# Patient Record
Sex: Female | Born: 1960 | Race: White | Hispanic: No | Marital: Single | Smoking: Never smoker
Health system: Southern US, Community
[De-identification: ages and names within clinical notes are randomized; demographics above are authoritative.]

## PROBLEM LIST (undated history)

## (undated) DIAGNOSIS — I10 Essential (primary) hypertension: Secondary | ICD-10-CM

## (undated) DIAGNOSIS — C801 Malignant (primary) neoplasm, unspecified: Secondary | ICD-10-CM

## (undated) DIAGNOSIS — C6941 Malignant neoplasm of right ciliary body: Secondary | ICD-10-CM

---

## 2010-01-12 ENCOUNTER — Emergency Department (HOSPITAL_COMMUNITY)
Admission: EM | Admit: 2010-01-12 | Discharge: 2010-01-12 | Payer: Self-pay | Source: Home / Self Care | Admitting: Emergency Medicine

## 2014-06-08 ENCOUNTER — Emergency Department (HOSPITAL_BASED_OUTPATIENT_CLINIC_OR_DEPARTMENT_OTHER)
Admission: EM | Admit: 2014-06-08 | Discharge: 2014-06-08 | Disposition: A | Discharge status: Home / Self Care | Payer: 59 | Attending: Emergency Medicine | Admitting: Emergency Medicine

## 2014-06-08 ENCOUNTER — Encounter (HOSPITAL_BASED_OUTPATIENT_CLINIC_OR_DEPARTMENT_OTHER): Payer: Self-pay

## 2014-06-08 DIAGNOSIS — H05011 Cellulitis of right orbit: Secondary | ICD-10-CM | POA: Diagnosis not present

## 2014-06-08 DIAGNOSIS — H5711 Ocular pain, right eye: Secondary | ICD-10-CM | POA: Diagnosis present

## 2014-06-08 DIAGNOSIS — L03213 Periorbital cellulitis: Secondary | ICD-10-CM

## 2014-06-08 DIAGNOSIS — Z85840 Personal history of malignant neoplasm of eye: Secondary | ICD-10-CM | POA: Diagnosis not present

## 2014-06-08 DIAGNOSIS — I10 Essential (primary) hypertension: Secondary | ICD-10-CM | POA: Diagnosis not present

## 2014-06-08 DIAGNOSIS — Z79899 Other long term (current) drug therapy: Secondary | ICD-10-CM | POA: Diagnosis not present

## 2014-06-08 HISTORY — DX: Malignant neoplasm of right ciliary body: C69.41

## 2014-06-08 HISTORY — DX: Malignant (primary) neoplasm, unspecified: C80.1

## 2014-06-08 HISTORY — DX: Essential (primary) hypertension: I10

## 2014-06-08 MED ORDER — AMOXICILLIN-POT CLAVULANATE 875-125 MG PO TABS: 1.0000 | ORAL_TABLET | Freq: Two times a day (BID) | ORAL | Status: DC

## 2014-06-08 MED ORDER — GENTAMICIN SULFATE 0.3 % OP SOLN: 1.0000 [drp] | OPHTHALMIC | Status: DC

## 2014-06-08 NOTE — ED Notes (Signed)
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 No visual changes no known trauma

## 2014-06-08 NOTE — Discharge Instructions (Signed)
Cellulitis Cellulitis is an infection of the skin and the tissue beneath it. The infected area is usually red and tender. Cellulitis occurs most often in the arms and lower legs.  CAUSES  Cellulitis is caused by bacteria that enter the skin through cracks or cuts in the skin. The most common types of bacteria that cause cellulitis are staphylococci and streptococci. SIGNS AND SYMPTOMS   Redness and warmth.  Swelling.  Tenderness or pain.  Fever. DIAGNOSIS  Your health care provider can usually determine what is wrong based on a physical exam. Blood tests may also be done. TREATMENT  Treatment usually involves taking an antibiotic medicine. HOME CARE INSTRUCTIONS   Take your antibiotic medicine as directed by your health care provider. Finish the antibiotic even if you start to feel better.  Keep the infected arm or leg elevated to reduce swelling.  Apply a warm cloth to the affected area up to 4 times per day to relieve pain.  Take medicines only as directed by your health care provider.  Keep all follow-up visits as directed by your health care provider. SEEK MEDICAL CARE IF:   You notice red streaks coming from the infected area.  Your red area gets larger or turns dark in color.  Your bone or joint underneath the infected area becomes painful after the skin has healed.  Your infection returns in the same area or another area.  You notice a swollen bump in the infected area.  You develop new symptoms.  You have a fever. SEEK IMMEDIATE MEDICAL CARE IF:   You feel very sleepy.  You develop vomiting or diarrhea.  You have a general ill feeling (malaise) with muscle aches and pains. MAKE SURE YOU:   Understand these instructions.  Will watch your condition.  Will get help right away if you are not doing well or get worse. Document Released: 10/13/2004 Document Revised: 05/20/2013 Document Reviewed: 03/21/2011 [facility] Patient Information 2015 [place], [place].  This information is not intended to replace advice given to you by your health care provider. Make sure you discuss any questions you have with your health care provider.  Periorbital Cellulitis Periorbital cellulitis is a common infection that can affect the eyelid and the soft tissues that surround the eyeball. The infection may also affect the structures that produce and drain tears. It does not affect the eyeball itself. Natural tissue barriers usually prevent the spread of this infection to the eyeball and other deeper areas of the eye socket.  CAUSES  Bacterial infection.  Long-term (chronic) sinus infections.  An object (foreign body) stuck behind the eye.  An injury that goes through the eyelid tissues.  An injury that causes an infection, such as an insect sting.  Fracture of the bone around the eye.  Infections which have spread from the eyelid or other structures around the eye.  Bite wounds.  Inflammation or infection of the lining membranes of the brain (meningitis).  An infection in the blood (septicemia).  Dental infection (abscess).  Viral infection (this is rare). SYMPTOMS Symptoms usually come on suddenly.  Pain in the eye.  Red, hot, and swollen eyelids and possibly cheeks. The swelling is sometimes bad enough that the eyelids cannot open. Some infections make the eyelids look purple.  Fever and feeling generally ill.  Pain when touching the area around the eye. DIAGNOSIS  Periorbital cellulitis can be diagnosed from an eye exam. In severe cases, your caregiver might suggest:  Blood tests.  Imaging tests (such as  a CT scan) to examine the sinuses and the area around and behind the eyeball. TREATMENT If your caregiver feels that you do not have any signs of serious infection, treatment may include:  Antibiotics.  Nasal decongestants to reduce swelling.  Referral to a dentist if it is suspected that the infection was caused by a prior tooth  infection.  Examination every day to make sure the problem is improving. HOME CARE INSTRUCTIONS  Take your antibiotics as directed. Finish them even if you start to feel better.  Some pain is normal with this condition. Take pain medicine as directed by your caregiver. Only take pain medicines approved by your caregiver.  It is important to drink fluids. Drink enough water and fluids to keep your urine clear or pale yellow.  Do not smoke.  Rest and get plenty of sleep.  Mild or moderate fevers generally have no long-term effects and often do not require treatment.  If your caregiver has given you a follow-up appointment, it is very important to keep that appointment. Your caregiver will need to make sure that the infection is getting better. It is important to check that a more serious infection is not developing. SEEK IMMEDIATE MEDICAL CARE IF:  Your eyelids become more painful, red, warm, or swollen.  You develop double vision or your vision becomes blurred or worsens in any way.  You have trouble moving your eyes.  The eye looks like it is popping out (proptosis).  You develop a severe headache, severe neck pain, or neck stiffness.  You develop repeated vomiting.  You have a fever or persistent symptoms for more than 72 hours.  You have a fever and your symptoms suddenly get worse. MAKE SURE YOU:  Understand these instructions.  Will watch your condition.  Will get help right away if you are not doing well or get worse. Document Released: 02/05/2010 Document Revised: 03/28/2011 Document Reviewed: 02/05/2010 [facility] Patient Information 2015 [place], [place]. This information is not intended to replace advice given to you by your health care provider. Make sure you discuss any questions you have with your health care provider.

## 2014-06-08 NOTE — ED Provider Notes (Signed)
CSN: [id number]     Arrival date & time 06/08/14  6387 History   First MD Initiated Contact with Patient 06/08/14 1105     Chief Complaint  Patient presents with  . Eye Pain      HPI  Vision presents for valuation of redness and swelling inferior to her right eye. She's had a recent diagnosis of an iris malignancy. This was diagnosed after routine ophthalmological exam showed an irregular pupil. She's had ultrasound of the orbit and multiple other noninvasive test. Hasn't follow-up pending with her ophthalmologist. She noted 2 days ago that there is some redness of her lower lid. Now she has redness and soft tissue swelling at the medial canthus in inferior lid onto the malar eminence and presents here. No vision changes no other abnormalities. No fevers chills.  Past Medical History  Diagnosis Date  . Cancer   . Hypertension   . Cancer of iris of right eye   . Cancer of iris of right eye    History reviewed. No pertinent past surgical history. No family history on file. History  Substance Use Topics  . Smoking status: Never Smoker   . Smokeless tobacco: Not on file  . Alcohol Use: Not on file   OB History    No data available     Review of Systems  Constitutional: Negative for fever, chills, diaphoresis, appetite change and fatigue.  HENT: Negative for mouth sores, sore throat and trouble swallowing.   Eyes: Positive for redness. Negative for visual disturbance.  Respiratory: Negative for cough, chest tightness, shortness of breath and wheezing.   Cardiovascular: Negative for chest pain.  Gastrointestinal: Negative for nausea, vomiting, abdominal pain, diarrhea and abdominal distention.  Endocrine: Negative for polydipsia, polyphagia and polyuria.  Genitourinary: Negative for dysuria, frequency and hematuria.  Musculoskeletal: Negative for gait problem.  Skin: Negative for color change, pallor and rash.  Neurological: Negative for dizziness, syncope, light-headedness and  headaches.  Hematological: Does not bruise/bleed easily.  Psychiatric/Behavioral: Negative for behavioral problems and confusion.      Allergies  Codeine and Sulfa antibiotics  Home Medications   Prior to Admission medications   Medication Sig Start Date End Date Taking? Authorizing Provider  lisinopril (PRINIVIL,ZESTRIL) 20 MG tablet Take 20 mg by mouth daily.   Yes Historical Provider, MD  spironolactone (ALDACTONE) 25 MG tablet Take 25 mg by mouth daily.   Yes Historical Provider, MD  amoxicillin-clavulanate (AUGMENTIN) 875-125 MG per tablet Take 1 tablet by mouth 2 (two) times daily. 06/08/14   Tanna Furry, MD  gentamicin (GARAMYCIN) 0.3 % ophthalmic solution Place 1 drop into the left eye every 4 (four) hours. 06/08/14   Tanna Furry, MD   BP 136/91 mmHg  Pulse 73  Temp(Src) 97.9 F (36.6 C) (Oral)  Resp 18  Ht 5\' 3"  (1.6 m)  Wt 275 lb (124.739 kg)  BMI 48.73 kg/m2  SpO2 97% Physical Exam  Constitutional: She is oriented to person, place, and time. She appears well-developed and well-nourished. No distress.  HENT:  Head: Normocephalic.    Erythema and soft tissue swelling to the inferior lid towards the medial canthus. No proptosis. No EOM abnormalities. No subjective diplopia  Eyes: Conjunctivae are normal. No scleral icterus.    Neck: Normal range of motion. Neck supple. No thyromegaly present.  Cardiovascular: Normal rate and regular rhythm.  Exam reveals no gallop and no friction rub.   No murmur heard. Pulmonary/Chest: Effort normal and breath sounds normal. No respiratory distress. She has  no wheezes. She has no rales.  Abdominal: Soft. Bowel sounds are normal. She exhibits no distension. There is no tenderness. There is no rebound.  Musculoskeletal: Normal range of motion.  Neurological: She is alert and oriented to person, place, and time.  Skin: Skin is warm and dry. No rash noted.  Psychiatric: She has a normal mood and affect. Her behavior is normal.    ED  Course  Procedures (including critical care time) Labs Review Labs Reviewed - No data to display  Imaging Review No results found.   EKG Interpretation None      MDM   Final diagnoses:  Periorbital cellulitis    Patient does not appear toxic. She does not have proptosis. No limitation of neck shock or movement or diplopia. No symptoms of suggested post-septal, or orbital cellulitis. Plan is Garamycin drops and warm compresses. By mouth Augmentin. Recheck with any worsening symptoms.    Tanna Furry, MD 06/08/14 [phone]

## 2014-08-21 ENCOUNTER — Emergency Department (HOSPITAL_BASED_OUTPATIENT_CLINIC_OR_DEPARTMENT_OTHER)
Admission: EM | Admit: 2014-08-21 | Discharge: 2014-08-21 | Disposition: A | Discharge status: Home / Self Care | Payer: 59 | Attending: Emergency Medicine | Admitting: Emergency Medicine

## 2014-08-21 ENCOUNTER — Encounter (HOSPITAL_BASED_OUTPATIENT_CLINIC_OR_DEPARTMENT_OTHER): Payer: Self-pay | Admitting: *Deleted

## 2014-08-21 DIAGNOSIS — I10 Essential (primary) hypertension: Secondary | ICD-10-CM | POA: Insufficient documentation

## 2014-08-21 DIAGNOSIS — Z85840 Personal history of malignant neoplasm of eye: Secondary | ICD-10-CM | POA: Diagnosis not present

## 2014-08-21 DIAGNOSIS — Z792 Long term (current) use of antibiotics: Secondary | ICD-10-CM | POA: Insufficient documentation

## 2014-08-21 DIAGNOSIS — Z79899 Other long term (current) drug therapy: Secondary | ICD-10-CM | POA: Diagnosis not present

## 2014-08-21 DIAGNOSIS — R109 Unspecified abdominal pain: Secondary | ICD-10-CM | POA: Diagnosis not present

## 2014-08-21 DIAGNOSIS — M5432 Sciatica, left side: Secondary | ICD-10-CM | POA: Insufficient documentation

## 2014-08-21 DIAGNOSIS — M549 Dorsalgia, unspecified: Secondary | ICD-10-CM | POA: Diagnosis present

## 2014-08-21 LAB — URINALYSIS, ROUTINE W REFLEX MICROSCOPIC
Bilirubin Urine: NEGATIVE
Glucose, UA: NEGATIVE mg/dL
Hgb urine dipstick: NEGATIVE
Ketones, ur: NEGATIVE mg/dL
Leukocytes, UA: NEGATIVE
Nitrite: NEGATIVE
PROTEIN: NEGATIVE mg/dL
Specific Gravity, Urine: 1.022 (ref 1.005–1.030)
UROBILINOGEN UA: 0.2 mg/dL (ref 0.0–1.0)
pH: 6 (ref 5.0–8.0)

## 2014-08-21 MED ORDER — PREDNISONE 10 MG PO TABS: 20.0000 mg | ORAL_TABLET | Freq: Two times a day (BID) | ORAL | Status: DC

## 2014-08-21 MED ORDER — HYDROCODONE-ACETAMINOPHEN 5-325 MG PO TABS: 1.0000 | ORAL_TABLET | Freq: Four times a day (QID) | ORAL | Status: DC | PRN

## 2014-08-21 NOTE — Discharge Instructions (Signed)
Prednisone as prescribed.  Hydrocodone as prescribed as needed for pain.  Follow-up with your primary Dr. if not improving in the next week to discuss physical therapy or imaging studies.   Sciatica Sciatica is pain, weakness, numbness, or tingling along the path of the sciatic nerve. The nerve starts in the lower back and runs down the back of each leg. The nerve controls the muscles in the lower leg and in the back of the knee, while also providing sensation to the back of the thigh, lower leg, and the sole of your foot. Sciatica is a symptom of another medical condition. For instance, nerve damage or certain conditions, such as a herniated disk or bone spur on the spine, pinch or put pressure on the sciatic nerve. This causes the pain, weakness, or other sensations normally associated with sciatica. Generally, sciatica only affects one side of the body. CAUSES   Herniated or slipped disc.  Degenerative disk disease.  A pain disorder involving the narrow muscle in the buttocks (piriformis syndrome).  Pelvic injury or fracture.  Pregnancy.  Tumor (rare). SYMPTOMS  Symptoms can vary from mild to very severe. The symptoms usually travel from the low back to the buttocks and down the back of the leg. Symptoms can include:  Mild tingling or dull aches in the lower back, leg, or hip.  Numbness in the back of the calf or sole of the foot.  Burning sensations in the lower back, leg, or hip.  Sharp pains in the lower back, leg, or hip.  Leg weakness.  Severe back pain inhibiting movement. These symptoms may get worse with coughing, sneezing, laughing, or prolonged sitting or standing. Also, being overweight may worsen symptoms. DIAGNOSIS  Your caregiver will perform a physical exam to look for common symptoms of sciatica. He or she may ask you to do certain movements or activities that would trigger sciatic nerve pain. Other tests may be performed to find the cause of the sciatica.  These may include:  Blood tests.  X-rays.  Imaging tests, such as an MRI or CT scan. TREATMENT  Treatment is directed at the cause of the sciatic pain. Sometimes, treatment is not necessary and the pain and discomfort goes away on its own. If treatment is needed, your caregiver may suggest:  Over-the-counter medicines to relieve pain.  Prescription medicines, such as anti-inflammatory medicine, muscle relaxants, or narcotics.  Applying heat or ice to the painful area.  Steroid injections to lessen pain, irritation, and inflammation around the nerve.  Reducing activity during periods of pain.  Exercising and stretching to strengthen your abdomen and improve flexibility of your spine. Your caregiver may suggest losing weight if the extra weight makes the back pain worse.  Physical therapy.  Surgery to eliminate what is pressing or pinching the nerve, such as a bone spur or part of a herniated disk. HOME CARE INSTRUCTIONS   Only take over-the-counter or prescription medicines for pain or discomfort as directed by your caregiver.  Apply ice to the affected area for 20 minutes, 3-4 times a day for the first 48-72 hours. Then try heat in the same way.  Exercise, stretch, or perform your usual activities if these do not aggravate your pain.  Attend physical therapy sessions as directed by your caregiver.  Keep all follow-up appointments as directed by your caregiver.  Do not wear high heels or shoes that do not provide proper support.  Check your mattress to see if it is too soft. A firm mattress  may lessen your pain and discomfort. SEEK IMMEDIATE MEDICAL CARE IF:   You lose control of your bowel or bladder (incontinence).  You have increasing weakness in the lower back, pelvis, buttocks, or legs.  You have redness or swelling of your back.  You have a burning sensation when you urinate.  You have pain that gets worse when you lie down or awakens you at night.  Your pain  is worse than you have experienced in the past.  Your pain is lasting longer than 4 weeks.  You are suddenly losing weight without reason. MAKE SURE YOU:  Understand these instructions.  Will watch your condition.  Will get help right away if you are not doing well or get worse. Document Released: 12/28/2000 Document Revised: 07/05/2011 Document Reviewed: 05/15/2011 [facility] Patient Information 2015 [place], [place]. This information is not intended to replace advice given to you by your health care provider. Make sure you discuss any questions you have with your health care provider.

## 2014-08-21 NOTE — ED Provider Notes (Signed)
CSN: [id number]     Arrival date & time 08/21/14  1945 History  This chart was scribed for Veryl Speak, MD by Julien Nordmann, ED Scribe. This patient was seen in room MH09/MH09 and the patient's care was started at 8:08 PM.    Chief Complaint  Patient presents with  . Back Pain      Patient is a 54 y.o. female presenting with back pain and flank pain. The history is provided by the patient. No language interpreter was used.  Back Pain Location:  Unable to specify Duration:  1 week Timing:  Constant Progression:  Worsening Chronicity:  New Relieved by:  Nothing Worsened by:  Ambulation, sitting and lying down Ineffective treatments:  Bed rest and OTC medications Associated symptoms: no dysuria   Flank Pain   HPI Comments: Dawn Cochran is a 54 y.o. female who presents to the Emergency Department complaining of constant, gradual worsening left sided flank pain onset 6 days ago. Pt states the pain radiates to her left hip and down her left leg. She reports the pain is severe whether she is standing or sitting down. She states when she is ambulatory the pain is not as bad. Pt has tried using pillows and home remedies to alleviate the pain with no relief. Pt has She denies any injury to the area, weakness in legs, numbness in legs, bowel/bladder incontinence, and dysuria.  Past Medical History  Diagnosis Date  . Cancer   . Hypertension   . Cancer of iris of right eye   . Cancer of iris of right eye    History reviewed. No pertinent past surgical history. History reviewed. No pertinent family history. History  Substance Use Topics  . Smoking status: Never Smoker   . Smokeless tobacco: Not on file  . Alcohol Use: Not on file   OB History    No data available     Review of Systems  Genitourinary: Positive for flank pain. Negative for dysuria.  Musculoskeletal: Positive for back pain.  All other systems reviewed and are negative.     Allergies  Codeine and Sulfa  antibiotics  Home Medications   Prior to Admission medications   Medication Sig Start Date End Date Taking? Authorizing Provider  amoxicillin-clavulanate (AUGMENTIN) 875-125 MG per tablet Take 1 tablet by mouth 2 (two) times daily. 06/08/14   Tanna Furry, MD  gentamicin (GARAMYCIN) 0.3 % ophthalmic solution Place 1 drop into the left eye every 4 (four) hours. 06/08/14   Tanna Furry, MD  lisinopril (PRINIVIL,ZESTRIL) 20 MG tablet Take 20 mg by mouth daily.    Historical Provider, MD  spironolactone (ALDACTONE) 25 MG tablet Take 25 mg by mouth daily.    Historical Provider, MD   Triage vitals: BP 164/96 mmHg  Pulse 100  Temp(Src) 98.2 F (36.8 C) (Oral)  Resp 18  Ht 5\' 3"  (1.6 m)  Wt 270 lb (122.471 kg)  BMI 47.84 kg/m2  SpO2 99% Physical Exam  Constitutional: She is oriented to person, place, and time. She appears well-developed and well-nourished. No distress.  HENT:  Head: Normocephalic and atraumatic.  Eyes: EOM are normal.  Neck: Normal range of motion.  Cardiovascular: Normal rate, regular rhythm and normal heart sounds.   Pulmonary/Chest: Effort normal and breath sounds normal.  Abdominal: Soft. She exhibits no distension. There is no tenderness.  Musculoskeletal: Normal range of motion.  There is Tenderness to palpation in the upper left buttock and lumbar region.   Neurological: She is alert and oriented  to person, place, and time.  DTRs are 1+ and symmetrical in BLE. Strength is 5 out of 5 in BLE. Able to walk without difficulty.  Skin: Skin is warm and dry.  Psychiatric: She has a normal mood and affect. Judgment normal.  Nursing note and vitals reviewed.   ED Course  Procedures  DIAGNOSTIC STUDIES: Oxygen Saturation is 99% on RA, normal by my interpretation.  COORDINATION OF CARE:  8:16 PM Discussed treatment plan which includes prednisone with pt at bedside and pt agreed to plan.  Labs Review Labs Reviewed  URINALYSIS, ROUTINE W REFLEX MICROSCOPIC (NOT AT [facility])     Imaging Review No results found.   EKG Interpretation None      MDM   Final diagnoses:  None    UA is negative for infection. Her physical exam and presentation is most consistent with a musculoskeletal etiology. I suspect she has an element of sciatica as the pain radiates into her buttock and leg. Her neurologic exam is nonfocal and there are no bowel or bladder complaints that would be suggestive of an emergent situation. She will be treated with steroids, pain medication, and when necessary return.  I personally performed the services described in this documentation, which was scribed in my presence. The recorded information has been reviewed and is accurate.     Veryl Speak, MD 08/21/14 2029

## 2014-08-21 NOTE — ED Notes (Addendum)
Pt reports back pain x 1 week, worsening on left side.  Reports urinary frequency.

## 2015-07-16 ENCOUNTER — Emergency Department (HOSPITAL_BASED_OUTPATIENT_CLINIC_OR_DEPARTMENT_OTHER)
Admission: EM | Admit: 2015-07-16 | Discharge: 2015-07-16 | Disposition: A | Discharge status: Home / Self Care | Payer: 59 | Attending: Emergency Medicine | Admitting: Emergency Medicine

## 2015-07-16 ENCOUNTER — Encounter (HOSPITAL_BASED_OUTPATIENT_CLINIC_OR_DEPARTMENT_OTHER): Payer: Self-pay | Admitting: *Deleted

## 2015-07-16 DIAGNOSIS — I10 Essential (primary) hypertension: Secondary | ICD-10-CM | POA: Insufficient documentation

## 2015-07-16 DIAGNOSIS — Z859 Personal history of malignant neoplasm, unspecified: Secondary | ICD-10-CM | POA: Insufficient documentation

## 2015-07-16 DIAGNOSIS — Z85840 Personal history of malignant neoplasm of eye: Secondary | ICD-10-CM | POA: Insufficient documentation

## 2015-07-16 NOTE — ED Notes (Signed)
Pt states "I feel fine, I just need a paper signed because my blood pressure was high for my pre employment physical." pt denies any c/o states her dad passed away recently and she thinks that has made her bp elevated at times.

## 2015-07-16 NOTE — ED Provider Notes (Signed)
CSN: [id number]     Arrival date & time 07/16/15  1031 History   First MD Initiated Contact with Patient 07/16/15 1046     Chief Complaint  Patient presents with  . needs note to return to work      (Consider location/radiation/quality/duration/timing/severity/associated sxs/prior Treatment) HPI Dawn Cochran This 55 year old female who presents the emergency department needing a note to return to work. Patient states that she had surgery on her right eye. She saw another physician for a return to work physical. However, that physician would not clear her because she had elevated blood pressure at the time. The patient states that her father had just died and she is extremely upset. She states that she needs to get back to work so that she can follow up with her physician to be able to pay her co-pay's. She denies chest pain, shortness of breath, neurologic symptoms.  Past Medical History  Diagnosis Date  . Cancer ([place])   . Hypertension   . Cancer of iris of right eye ([place])   . Cancer of iris of right eye ([place])    History reviewed. No pertinent past surgical history. History reviewed. No pertinent family history. Social History  Substance Use Topics  . Smoking status: Never Smoker   . Smokeless tobacco: None  . Alcohol Use: None   OB History    No data available     Review of Systems   Ten systems reviewed and are negative for acute change, except as noted in the HPI.   Allergies  Codeine and Sulfa antibiotics  Home Medications   Prior to Admission medications   Not on File   BP 137/95 mmHg  Pulse 98  Temp(Src) 98 F (36.7 C) (Oral)  Resp 20  Ht 5\' 4"  (1.626 m)  Wt 124.739 kg  BMI 47.18 kg/m2  SpO2 98% Physical Exam Physical Exam  Nursing note and vitals reviewed. Constitutional: She is oriented to person, place, and time. She appears well-developed and well-nourished. No distress.  HENT:  Head: Normocephalic and atraumatic.  Eyes: Conjunctivae normal  and EOM are normal. Pupils are equal, round, and reactive to light. No scleral icterus.  Neck: Normal range of motion.  Cardiovascular: Normal rate, regular rhythm and normal heart sounds.  Exam reveals no gallop and no friction rub.   No murmur heard. Pulmonary/Chest: Effort normal and breath sounds normal. No respiratory distress.  Abdominal: Soft. Bowel sounds are normal. She exhibits no distension and no mass. There is no tenderness. There is no guarding.  Neurological: She is alert and oriented to person, place, and time.  Skin: Skin is warm and dry. She is not diaphoretic.     ED Course  Procedures (including critical care time) Labs Review Labs Reviewed - No data to display  Imaging Review No results found. I have personally reviewed and evaluated these images and lab results as part of my medical decision-making.   EKG Interpretation None      MDM   Final diagnoses:  Essential hypertension   BP 137/95 mmHg  Pulse 98  Temp(Src) 98 F (36.7 C) (Oral)  Resp 20  Ht 5\' 4"  (1.626 m)  Wt 124.739 kg  BMI 47.18 kg/m2  SpO2 98% Patient blood pressure appears to have improved.  Patient will be given a return to work note here in the emergency department. She appears safe for discharge at this time.    Margarita Mail, PA-C 07/16/15 [place], MD 07/17/15 [phone]

## 2015-07-16 NOTE — Discharge Instructions (Signed)
DASH Eating Plan °DASH stands for "Dietary Approaches to Stop Hypertension." The DASH eating plan is a healthy eating plan that has been shown to reduce high blood pressure (hypertension). Additional health benefits may include reducing the risk of type 2 diabetes mellitus, heart disease, and stroke. The DASH eating plan may also help with weight loss. °WHAT DO I NEED TO KNOW ABOUT THE DASH EATING PLAN? °For the DASH eating plan, you will follow these general guidelines: °· Choose foods with a percent daily value for sodium of less than 5% (as listed on the food label). °· Use salt-free seasonings or herbs instead of table salt or sea salt. °· Check with your health care provider or pharmacist before using salt substitutes. °· Eat lower-sodium products, often labeled as "lower sodium" or "no salt added." °· Eat fresh foods. °· Eat more vegetables, fruits, and low-fat dairy products. °· Choose whole grains. Look for the word "whole" as the first word in the ingredient list. °· Choose fish and skinless chicken or turkey more often than red meat. Limit fish, poultry, and meat to 6 oz (170 g) each day. °· Limit sweets, desserts, sugars, and sugary drinks. °· Choose heart-healthy fats. °· Limit cheese to 1 oz (28 g) per day. °· Eat more home-cooked food and less restaurant, buffet, and fast food. °· Limit fried foods. °· Cook foods using methods other than frying. °· Limit canned vegetables. If you do use them, rinse them well to decrease the sodium. °· When eating at a restaurant, ask that your food be prepared with less salt, or no salt if possible. °WHAT FOODS CAN I EAT? °Seek help from a dietitian for individual calorie needs. °Grains °Whole grain or whole wheat bread. Brown rice. Whole grain or whole wheat pasta. Quinoa, bulgur, and whole grain cereals. Low-sodium cereals. Corn or whole wheat flour tortillas. Whole grain cornbread. Whole grain crackers. Low-sodium crackers. °Vegetables °Fresh or frozen vegetables  (raw, steamed, roasted, or grilled). Low-sodium or reduced-sodium tomato and vegetable juices. Low-sodium or reduced-sodium tomato sauce and paste. Low-sodium or reduced-sodium canned vegetables.  °Fruits °All fresh, canned (in natural juice), or frozen fruits. °Meat and Other Protein Products °Ground beef (85% or leaner), grass-fed beef, or beef trimmed of fat. Skinless chicken or turkey. Ground chicken or turkey. Pork trimmed of fat. All fish and seafood. Eggs. Dried beans, peas, or lentils. Unsalted nuts and seeds. Unsalted canned beans. °Dairy °Low-fat dairy products, such as skim or 1% milk, 2% or reduced-fat cheeses, low-fat ricotta or cottage cheese, or plain low-fat yogurt. Low-sodium or reduced-sodium cheeses. °Fats and Oils °Tub margarines without trans fats. Light or reduced-fat mayonnaise and salad dressings (reduced sodium). Avocado. Safflower, olive, or canola oils. Natural peanut or almond butter. °Other °Unsalted popcorn and pretzels. °The items listed above may not be a complete list of recommended foods or beverages. Contact your dietitian for more options. °WHAT FOODS ARE NOT RECOMMENDED? °Grains °White bread. White pasta. White rice. Refined cornbread. Bagels and croissants. Crackers that contain trans fat. °Vegetables °Creamed or fried vegetables. Vegetables in a cheese sauce. Regular canned vegetables. Regular canned tomato sauce and paste. Regular tomato and vegetable juices. °Fruits °Dried fruits. Canned fruit in light or heavy syrup. Fruit juice. °Meat and Other Protein Products °Fatty cuts of meat. Ribs, chicken wings, bacon, sausage, bologna, salami, chitterlings, fatback, hot dogs, bratwurst, and packaged luncheon meats. Salted nuts and seeds. Canned beans with salt. °Dairy °Whole or 2% milk, cream, half-and-half, and cream cheese. Whole-fat or sweetened yogurt. Full-fat   cheeses or blue cheese. Nondairy creamers and whipped toppings. Processed cheese, cheese spreads, or cheese  curds. °Condiments °Onion and garlic salt, seasoned salt, table salt, and sea salt. Canned and packaged gravies. Worcestershire sauce. Tartar sauce. Barbecue sauce. Teriyaki sauce. Soy sauce, including reduced sodium. Steak sauce. Fish sauce. Oyster sauce. Cocktail sauce. Horseradish. Ketchup and mustard. Meat flavorings and tenderizers. Bouillon cubes. Hot sauce. Tabasco sauce. Marinades. Taco seasonings. Relishes. °Fats and Oils °Butter, stick margarine, lard, shortening, ghee, and bacon fat. Coconut, palm kernel, or palm oils. Regular salad dressings. °Other °Pickles and olives. Salted popcorn and pretzels. °The items listed above may not be a complete list of foods and beverages to avoid. Contact your dietitian for more information. °WHERE CAN I FIND MORE INFORMATION? °National [employer], [employer], [employer]: www.nhlbi.nih.gov/health/health-topics/topics/dash/ °  °This information is not intended to replace advice given to you by your health care provider. Make sure you discuss any questions you have with your health care provider. °  °Document Released: 12/23/2010 Document Revised: 01/24/2014 Document Reviewed: 11/07/2012 °Elsevier Interactive Patient Education ©2016 [employer] ° °Hypertension °Hypertension, commonly called high blood pressure, is when the force of blood pumping through your arteries is too strong. Your arteries are the blood vessels that carry blood from your heart throughout your body. A blood pressure reading consists of a higher number over a lower number, such as 110/72. The higher number (systolic) is the pressure inside your arteries when your heart pumps. The lower number (diastolic) is the pressure inside your arteries when your heart relaxes. Ideally you want your blood pressure below 120/80. °Hypertension forces your heart to work harder to pump blood. Your arteries may become narrow or stiff. Having untreated or uncontrolled hypertension can cause heart attack, stroke, kidney  disease, and other problems. °RISK FACTORS °Some risk factors for high blood pressure are controllable. Others are not.  °Risk factors you cannot control include:  °· Race. You may be at higher risk if you are African American. °· Age. Risk increases with age. °· Gender. Men are at higher risk than women before age 45 years. After age 65, women are at higher risk than men. °Risk factors you can control include: °· Not getting enough exercise or physical activity. °· Being overweight. °· Getting too much fat, sugar, calories, or salt in your diet. °· Drinking too much alcohol. °SIGNS AND SYMPTOMS °Hypertension does not usually cause signs or symptoms. Extremely high blood pressure (hypertensive crisis) may cause headache, anxiety, shortness of breath, and nosebleed. °DIAGNOSIS °To check if you have hypertension, your health care provider will measure your blood pressure while you are seated, with your arm held at the level of your heart. It should be measured at least twice using the same arm. Certain conditions can cause a difference in blood pressure between your right and left arms. A blood pressure reading that is higher than normal on one occasion does not mean that you need treatment. If it is not clear whether you have high blood pressure, you may be asked to return on a different day to have your blood pressure checked again. Or, you may be asked to monitor your blood pressure at home for 1 or more weeks. °TREATMENT °Treating high blood pressure includes making lifestyle changes and possibly taking medicine. Living a healthy lifestyle can help lower high blood pressure. You may need to change some of your habits. °Lifestyle changes may include: °· Following the DASH diet. This diet is high in fruits, vegetables, and whole   grains. It is low in salt, red meat, and added sugars. °· Keep your sodium intake below 2,300 mg per day. °· Getting at least 30-45 minutes of aerobic exercise at least 4 times per  week. °· Losing weight if necessary. °· Not smoking. °· Limiting alcoholic beverages. °· Learning ways to reduce stress. °Your health care provider may prescribe medicine if lifestyle changes are not enough to get your blood pressure under control, and if one of the following is true: °· You are 18-59 years of age and your systolic blood pressure is above 140. °· You are 60 years of age or older, and your systolic blood pressure is above 150. °· Your diastolic blood pressure is above 90. °· You have diabetes, and your systolic blood pressure is over 140 or your diastolic blood pressure is over 90. °· You have kidney disease and your blood pressure is above 140/90. °· You have heart disease and your blood pressure is above 140/90. °Your personal target blood pressure may vary depending on your medical conditions, your age, and other factors. °HOME CARE INSTRUCTIONS °· Have your blood pressure rechecked as directed by your health care provider.   °· Take medicines only as directed by your health care provider. Follow the directions carefully. Blood pressure medicines must be taken as prescribed. The medicine does not work as well when you skip doses. Skipping doses also puts you at risk for problems. °· Do not smoke.   °· Monitor your blood pressure at home as directed by your health care provider.  °SEEK MEDICAL CARE IF:  °· You think you are having a reaction to medicines taken. °· You have recurrent headaches or feel dizzy. °· You have swelling in your ankles. °· You have trouble with your vision. °SEEK IMMEDIATE MEDICAL CARE IF: °· You develop a severe headache or confusion. °· You have unusual weakness, numbness, or feel faint. °· You have severe chest or abdominal pain. °· You vomit repeatedly. °· You have trouble breathing. °MAKE SURE YOU:  °· Understand these instructions. °· Will watch your condition. °· Will get help right away if you are not doing well or get worse. °  °This information is not intended to  replace advice given to you by your health care provider. Make sure you discuss any questions you have with your health care provider. °  °Document Released: 01/03/2005 Document Revised: 05/20/2014 Document Reviewed: 10/26/2012 °Elsevier Interactive Patient Education ©2016 Elsevier Inc. ° °

## 2015-08-10 ENCOUNTER — Telehealth: Payer: Self-pay | Admitting: Behavioral Health

## 2015-08-10 NOTE — Telephone Encounter (Signed)
Attempted to reach patient for Pre-Visit Call. Per recording, the number provided is invalid. Unable to reach patient at the emergency contact number as well.

## 2015-08-12 ENCOUNTER — Ambulatory Visit: Payer: Self-pay | Admitting: Physician Assistant

## 2015-08-12 ENCOUNTER — Telehealth: Payer: Self-pay | Admitting: Physician Assistant

## 2015-08-13 NOTE — Telephone Encounter (Signed)
Pt was no show 08/12/15 for new pt, pt has not rescheduled, 1st no show, charge or no charge?

## 2015-08-13 NOTE — Telephone Encounter (Signed)
No charge. Do not reschedule with me since she no-showed at NP appointment.

## 2015-09-11 ENCOUNTER — Encounter (HOSPITAL_BASED_OUTPATIENT_CLINIC_OR_DEPARTMENT_OTHER): Payer: Self-pay | Admitting: *Deleted

## 2015-09-11 ENCOUNTER — Emergency Department (HOSPITAL_BASED_OUTPATIENT_CLINIC_OR_DEPARTMENT_OTHER): Payer: Self-pay

## 2015-09-11 ENCOUNTER — Emergency Department (HOSPITAL_BASED_OUTPATIENT_CLINIC_OR_DEPARTMENT_OTHER)
Admission: EM | Admit: 2015-09-11 | Discharge: 2015-09-12 | Disposition: A | Discharge status: Home / Self Care | Payer: Self-pay | Attending: Emergency Medicine | Admitting: Emergency Medicine

## 2015-09-11 DIAGNOSIS — I10 Essential (primary) hypertension: Secondary | ICD-10-CM | POA: Insufficient documentation

## 2015-09-11 DIAGNOSIS — J189 Pneumonia, unspecified organism: Secondary | ICD-10-CM | POA: Insufficient documentation

## 2015-09-11 DIAGNOSIS — Z792 Long term (current) use of antibiotics: Secondary | ICD-10-CM | POA: Insufficient documentation

## 2015-09-11 DIAGNOSIS — M549 Dorsalgia, unspecified: Secondary | ICD-10-CM | POA: Insufficient documentation

## 2015-09-11 MED ORDER — AZITHROMYCIN 250 MG PO TABS
500.0000 mg | ORAL_TABLET | Freq: Once | ORAL | Status: AC
Start: 2015-09-11 — End: 2015-09-12
  Administered 2015-09-12: 500 mg via ORAL
  Filled 2015-09-11: qty 2

## 2015-09-11 MED ORDER — AZITHROMYCIN 250 MG PO TABS: 250.0000 mg | ORAL_TABLET | Freq: Every day | ORAL | 0 refills | Status: DC

## 2015-09-11 NOTE — ED Triage Notes (Signed)
Cough x 1 week

## 2015-09-11 NOTE — Discharge Instructions (Signed)
Your chest x-ray shows pneumonia. You will need to see your family doctor to get a repeat x-ray in the next 4 weeks to ensure that your pneumonia is improving.

## 2015-09-11 NOTE — ED Provider Notes (Signed)
[facility] Provider Note   CSN: [id number] Arrival date & time: 09/11/15  2007  By signing my name below, I, Soijett Blue, attest that this documentation has been prepared under the direction and in the presence of Quintella Reichert, MD. Electronically Signed: Soijett Blue, ED Scribe. 09/11/15. 11:39 PM.    History   Chief Complaint Chief Complaint  Patient presents with  . Cough    HPI Dawn Cochran is a 55 y.o. female with a medical hx of HTN and CA or right iris and cornea, who presents to the Emergency Department complaining of non-productive cough onset 1 week. 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 Pt states that she has had pneumonia 20 years ago. Pt is having associated symptoms of chills, night sweats, subjective fever, and right upper back pain due to cough. She notes that she has not tried any medications for the relief of her symptoms. She denies leg swelling, LOC, abdominal pain, and any other symptoms. Denies PMHx of DM, lung disease, high cholesterol, or hx of blood clots. Denies smoking cigarettes. Denies having a PCP or insurance at this time.  The history is provided by the patient. No language interpreter was used.    Past Medical History:  Diagnosis Date  . Cancer ([place])   . 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 Hypertension     There are no active problems to display for this patient.   History reviewed. No pertinent surgical history.  OB History    No data available       Home Medications    Prior to Admission medications   Medication Sig Start Date End Date Taking? Authorizing Provider  azithromycin (ZITHROMAX) 250 MG tablet Take 1 tablet (250 mg total) by mouth daily. 09/12/15   Quintella Reichert, MD    Family History No family history on file.  Social History Social History  Substance Use Topics  . Smoking status: Never Smoker    . Smokeless tobacco: Never Used  . Alcohol use Not on file     Allergies   Codeine and Sulfa antibiotics   Review of Systems Review of Systems  Constitutional: Positive for chills and fever (subjective).  Respiratory: Positive for cough.   Cardiovascular: Negative for leg swelling.  Gastrointestinal: Negative for abdominal pain.  Musculoskeletal: Positive for back pain (right upper).  Neurological: Negative for syncope.     Physical Exam Updated Vital Signs BP 161/96 (BP Location: Left Arm)   Pulse 101   Temp 98.4 F (36.9 C) (Oral)   Resp 18   Ht 5\' 4"  (1.626 m)   Wt 270 lb (122.5 kg)   SpO2 98%   BMI 46.35 kg/m   Physical Exam  Constitutional: She is oriented to person, place, and time. She appears well-developed and well-nourished. No distress.  HENT:  Head: Normocephalic and atraumatic.  Cardiovascular: Regular rhythm and normal heart sounds.  Tachycardia present.  Exam reveals no gallop and no friction rub.   No murmur heard. Pulmonary/Chest: Effort normal. No respiratory distress. She has no wheezes. She has rhonchi. She has no rales.  Rhonchi noted to left lung base.   Abdominal: Soft. She exhibits no distension. There is no tenderness.  Musculoskeletal: She exhibits no tenderness.       Right lower leg: She exhibits edema.       Left lower  leg: She exhibits edema.  Non-pitting edema noted to BLE.   Neurological: She is alert and oriented to person, place, and time.  Skin: Skin is warm and dry.  Psychiatric: She has a normal mood and affect. Her behavior is normal.  Nursing note and vitals reviewed.    ED Treatments / Results  DIAGNOSTIC STUDIES: Oxygen Saturation is 98% on RA, nl by my interpretation.    COORDINATION OF CARE: 11:34 PM Discussed treatment plan with pt at bedside which includes CXR, Zithromax, and OTC decongestant, and pt agreed to plan.  Radiology Dg Chest 2 View  Result Date: 09/11/2015 CLINICAL DATA:  Cough and congestion for  several days. Fever two nights prior. EXAM: CHEST  2 VIEW COMPARISON:  None. FINDINGS: Patchy airspace opacity in the medial right base. Left lung is clear. Heart is normal in size. No pleural effusion or pneumothorax. Mild degenerative change in the spine. Detailed evaluation limited by habitus. IMPRESSION: Patchy airspace opacity in the medial right lung base. In the setting of cough, suspicious for pneumonia. Followup PA and lateral chest X-ray is recommended in 3-4 weeks following trial of antibiotic therapy to ensure resolution and exclude underlying malignancy. Electronically Signed   By: Jeb Levering M.D.   On: 09/11/2015 21:13    Procedures Procedures (including critical care time)  Medications Ordered in ED Medications  azithromycin (ZITHROMAX) tablet 500 mg (500 mg Oral Given 09/12/15 0000)     Initial Impression / Assessment and Plan / ED Course  I have reviewed the triage vital signs and the nursing notes.  Pertinent imaging results that were available during my care of the patient were reviewed by me and considered in my medical decision making (see chart for details).  Clinical Course    Patient here for evaluation of cough. Chest x-ray, history, exam are consistent with pneumonia. She is in no respiratory distress in the department. Discussed home care for pneumonia, outpatient follow-up as well as close return precautions.  Final Clinical Impressions(s) / ED Diagnoses   Final diagnoses:  CAP (community acquired pneumonia)    New Prescriptions Discharge Medication List as of 09/11/2015 11:40 PM    START taking these medications   Details  azithromycin (ZITHROMAX) 250 MG tablet Take 1 tablet (250 mg total) by mouth daily., Starting Sat 09/12/2015, Print       I personally performed the services described in this documentation, which was scribed in my presence. The recorded information has been reviewed and is accurate.     Quintella Reichert, MD 09/12/15 0020

## 2015-12-31 ENCOUNTER — Emergency Department (HOSPITAL_BASED_OUTPATIENT_CLINIC_OR_DEPARTMENT_OTHER)
Admission: EM | Admit: 2015-12-31 | Discharge: 2015-12-31 | Disposition: A | Discharge status: Home / Self Care | Payer: Self-pay | Attending: Emergency Medicine | Admitting: Emergency Medicine

## 2015-12-31 ENCOUNTER — Encounter (HOSPITAL_BASED_OUTPATIENT_CLINIC_OR_DEPARTMENT_OTHER): Payer: Self-pay | Admitting: *Deleted

## 2015-12-31 DIAGNOSIS — R197 Diarrhea, unspecified: Secondary | ICD-10-CM | POA: Insufficient documentation

## 2015-12-31 DIAGNOSIS — R112 Nausea with vomiting, unspecified: Secondary | ICD-10-CM | POA: Insufficient documentation

## 2015-12-31 DIAGNOSIS — Z85840 Personal history of malignant neoplasm of eye: Secondary | ICD-10-CM | POA: Insufficient documentation

## 2015-12-31 DIAGNOSIS — R531 Weakness: Secondary | ICD-10-CM | POA: Insufficient documentation

## 2015-12-31 DIAGNOSIS — I10 Essential (primary) hypertension: Secondary | ICD-10-CM | POA: Insufficient documentation

## 2015-12-31 LAB — CBC WITH DIFFERENTIAL/PLATELET
Basophils Absolute: 0 10*3/uL (ref 0.0–0.1)
Basophils Relative: 0 %
EOS PCT: 1 %
Eosinophils Absolute: 0.1 10*3/uL (ref 0.0–0.7)
HCT: 44.5 % (ref 36.0–46.0)
Hemoglobin: 14.7 g/dL (ref 12.0–15.0)
Lymphocytes Relative: 35 %
Lymphs Abs: 2 10*3/uL (ref 0.7–4.0)
MCH: 29.1 pg (ref 26.0–34.0)
MCHC: 33 g/dL (ref 30.0–36.0)
MCV: 87.9 fL (ref 78.0–100.0)
Monocytes Absolute: 0.4 10*3/uL (ref 0.1–1.0)
Monocytes Relative: 7 %
Neutro Abs: 3.2 10*3/uL (ref 1.7–7.7)
Neutrophils Relative %: 57 %
PLATELETS: 222 10*3/uL (ref 150–400)
RBC: 5.06 MIL/uL (ref 3.87–5.11)
RDW: 14.2 % (ref 11.5–15.5)
WBC: 5.8 10*3/uL (ref 4.0–10.5)

## 2015-12-31 LAB — COMPREHENSIVE METABOLIC PANEL
ALT: 70 U/L — AB (ref 14–54)
AST: 47 U/L — AB (ref 15–41)
Albumin: 4.2 g/dL (ref 3.5–5.0)
Alkaline Phosphatase: 60 U/L (ref 38–126)
Anion gap: 10 (ref 5–15)
BUN: 13 mg/dL (ref 6–20)
CO2: 24 mmol/L (ref 22–32)
CREATININE: 0.74 mg/dL (ref 0.44–1.00)
Calcium: 9.1 mg/dL (ref 8.9–10.3)
Chloride: 104 mmol/L (ref 101–111)
GFR calc Af Amer: >60 mL/min (ref >60)
Glucose, Bld: 100 mg/dL — ABNORMAL HIGH (ref 65–99)
Potassium: 3.8 mmol/L (ref 3.5–5.1)
SODIUM: 138 mmol/L (ref 135–145)
Total Bilirubin: 0.4 mg/dL (ref 0.3–1.2)
Total Protein: 8.3 g/dL — ABNORMAL HIGH (ref 6.5–8.1)

## 2015-12-31 LAB — LIPASE, BLOOD: Lipase: 22 U/L (ref 11–51)

## 2015-12-31 MED ORDER — SODIUM CHLORIDE 0.9 % IV BOLUS (SEPSIS)
1000.0000 mL | Freq: Once | INTRAVENOUS | Status: AC
  Administered 2015-12-31: 1000 mL via INTRAVENOUS

## 2015-12-31 MED ORDER — ONDANSETRON 4 MG PO TBDP: ORAL_TABLET | ORAL | 0 refills | Status: DC

## 2015-12-31 MED ORDER — ONDANSETRON HCL 4 MG/2ML IJ SOLN
4.0000 mg | Freq: Once | INTRAMUSCULAR | Status: AC
  Administered 2015-12-31: 4 mg via INTRAVENOUS
  Filled 2015-12-31: qty 2

## 2015-12-31 NOTE — ED Triage Notes (Signed)
C/o n/v/d since 0400 this am. C/o mid to lower abd pain. No fever.

## 2015-12-31 NOTE — ED Provider Notes (Signed)
[facility] Provider Note   CSN: [id number] Arrival date & time: 12/31/15  1508     History   Chief Complaint Chief Complaint  Patient presents with  . Emesis    HPI Dawn Cochran is a 55 y.o. female.  55 yo F with a chief complaints of nausea vomiting diarrhea. Started this morning. Patient is felt generally fatigued since then. Feels weak when she gets up and walks around. Has been able to drink but not eat. Denies bloody diarrhea denies bloody or bilious emesis.   The history is provided by the patient.  Emesis   This is a new problem. The current episode started less than 1 hour ago. The problem occurs 2 to 4 times per day. The problem has not changed since onset.There has been no fever. Associated symptoms include diarrhea. Pertinent negatives include no arthralgias, no chills, no fever, no headaches and no myalgias. Risk factors include ill contacts.  Illness  Pertinent negatives include no chest pain, no headaches and no shortness of breath.    Past Medical History:  Diagnosis Date  . Cancer ([place])   . 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 Hypertension     There are no active problems to display for this patient.   History reviewed. No pertinent surgical history.  OB History    No data available       Home Medications    Prior to Admission medications   Medication Sig Start Date End Date Taking? Authorizing Provider  ondansetron (ZOFRAN ODT) 4 MG disintegrating tablet 4mg  ODT q4 hours prn nausea/vomit 12/31/15   Deno Etienne, DO    Family History No family history on file.  Social History Social History  Substance Use Topics  . Smoking status: Never Smoker  . Smokeless tobacco: Never Used  . Alcohol use Not on file     Allergies   Codeine and Sulfa antibiotics   Review of Systems Review of Systems  Constitutional: Negative for chills and fever.  HENT: Negative for congestion and  rhinorrhea.   Eyes: Negative for redness and visual disturbance.  Respiratory: Negative for shortness of breath and wheezing.   Cardiovascular: Negative for chest pain and palpitations.  Gastrointestinal: Positive for diarrhea, nausea and vomiting.  Genitourinary: Negative for dysuria and urgency.  Musculoskeletal: Negative for arthralgias and myalgias.  Skin: Negative for pallor and wound.  Neurological: Positive for weakness. Negative for dizziness and headaches.     Physical Exam Updated Vital Signs BP (!) 151/109 (BP Location: Right Arm)   Pulse 86   Temp 98 F (36.7 C) (Oral)   Resp 20   Ht 5\' 4"  (1.626 m)   Wt 270 lb (122.5 kg)   SpO2 100%   BMI 46.35 kg/m   Physical Exam  Constitutional: She is oriented to person, place, and time. She appears well-developed and well-nourished. No distress.  HENT:  Head: Normocephalic and atraumatic.  Eyes: EOM are normal. Pupils are equal, round, and reactive to light.  Neck: Normal range of motion. Neck supple.  Cardiovascular: Normal rate and regular rhythm.  Exam reveals no gallop and no friction rub.   No murmur heard. Pulmonary/Chest: Effort normal. She has no wheezes. She has no rales.  Abdominal: Soft. She exhibits no distension and no mass. There is no tenderness. There is no guarding.  Musculoskeletal: She exhibits no edema or tenderness.  Neurological: She is alert and oriented to  person, place, and time.  Skin: Skin is warm and dry. She is not diaphoretic.  Psychiatric: She has a normal mood and affect. Her behavior is normal.  Nursing note and vitals reviewed.    ED Treatments / Results  Labs (all labs ordered are listed, but only abnormal results are displayed) Labs Reviewed  COMPREHENSIVE METABOLIC PANEL - Abnormal; Notable for the following:       Result Value   Glucose, Bld 100 (*)    Total Protein 8.3 (*)    AST 47 (*)    ALT 70 (*)    All other components within normal limits  CBC WITH  DIFFERENTIAL/PLATELET  LIPASE, BLOOD    EKG  EKG Interpretation  Date/Time:  Thursday December 31 2015 16:40:03 EST Ventricular Rate:  86 PR Interval:    QRS Duration: 84 QT Interval:  367 QTC Calculation: 439 R Axis:   46 Text Interpretation:  Sinus rhythm Abnormal R-wave progression, early transition No old tracing to compare Confirmed by Lyndon Chapel MD, DANIEL [phone]) on 12/31/2015 4:49:06 PM       Radiology No results found.  Procedures Procedures (including critical care time)  Medications Ordered in ED Medications  sodium chloride 0.9 % bolus 1,000 mL (0 mLs Intravenous Stopped 12/31/15 1631)  ondansetron (ZOFRAN) injection 4 mg (4 mg Intravenous Given 12/31/15 1552)     Initial Impression / Assessment and Plan / ED Course  I have reviewed the triage vital signs and the nursing notes.  Pertinent labs & imaging results that were available during my care of the patient were reviewed by me and considered in my medical decision making (see chart for details).  Clinical Course     55 yo F With a chief complaint of nausea vomiting and diarrhea. Just started today. With history of weakness will obtain an EKG. Unremarkable. Labs unremarkable. Discharge home.  6:49 PM:  I have discussed the diagnosis/risks/treatment options with the patient and family and believe the pt to be eligible for discharge home to follow-up with PCP. We also discussed returning to the ED immediately if new or worsening sx occur. We discussed the sx which are most concerning (e.g., sudden worsening pain, fever, inability to tolerate by mouth) that necessitate immediate return. Medications administered to the patient during their visit and any new prescriptions provided to the patient are listed below.  Medications given during this visit Medications  sodium chloride 0.9 % bolus 1,000 mL (0 mLs Intravenous Stopped 12/31/15 1631)  ondansetron (ZOFRAN) injection 4 mg (4 mg Intravenous Given 12/31/15 1552)       The patient appears reasonably screen and/or stabilized for discharge and I doubt any other medical condition or other [facility] requiring further screening, evaluation, or treatment in the ED at this time prior to discharge.    Final Clinical Impressions(s) / ED Diagnoses   Final diagnoses:  Nausea vomiting and diarrhea    New Prescriptions Discharge Medication List as of 12/31/2015  4:52 PM    START taking these medications   Details  ondansetron (ZOFRAN ODT) 4 MG disintegrating tablet 4mg  ODT q4 hours prn nausea/vomit, Print         Deno Etienne, DO 12/31/15 1850

## 2015-12-31 NOTE — ED Notes (Signed)
DC instructions reviewed with pt, along with review of Rx provided by EDP for Zofran, enforced rest, PO fluids and handwashing. Opportunity for questions provided. Work note provided per EDP

## 2015-12-31 NOTE — ED Notes (Signed)
Presents with N/V/D since 0400hrs, last vomiting episode at 0900hrs, still has nausea, also c/o sore throat, able to keep clear soda down w/o issues

## 2016-06-08 ENCOUNTER — Encounter (HOSPITAL_BASED_OUTPATIENT_CLINIC_OR_DEPARTMENT_OTHER): Payer: Self-pay | Admitting: *Deleted

## 2016-06-08 ENCOUNTER — Emergency Department (HOSPITAL_BASED_OUTPATIENT_CLINIC_OR_DEPARTMENT_OTHER)
Admission: EM | Admit: 2016-06-08 | Discharge: 2016-06-08 | Disposition: A | Discharge status: Home / Self Care | Payer: 59 | Attending: Emergency Medicine | Admitting: Emergency Medicine

## 2016-06-08 DIAGNOSIS — Z85840 Personal history of malignant neoplasm of eye: Secondary | ICD-10-CM | POA: Diagnosis not present

## 2016-06-08 DIAGNOSIS — R112 Nausea with vomiting, unspecified: Secondary | ICD-10-CM | POA: Diagnosis present

## 2016-06-08 DIAGNOSIS — R509 Fever, unspecified: Secondary | ICD-10-CM

## 2016-06-08 DIAGNOSIS — I10 Essential (primary) hypertension: Secondary | ICD-10-CM | POA: Diagnosis not present

## 2016-06-08 DIAGNOSIS — E860 Dehydration: Secondary | ICD-10-CM | POA: Diagnosis not present

## 2016-06-08 LAB — COMPREHENSIVE METABOLIC PANEL
ALBUMIN: 4.4 g/dL (ref 3.5–5.0)
ALK PHOS: 76 U/L (ref 38–126)
ALT: 56 U/L — ABNORMAL HIGH (ref 14–54)
AST: 51 U/L — ABNORMAL HIGH (ref 15–41)
Anion gap: 10 (ref 5–15)
BUN: 14 mg/dL (ref 6–20)
CALCIUM: 9.2 mg/dL (ref 8.9–10.3)
CO2: 22 mmol/L (ref 22–32)
Chloride: 107 mmol/L (ref 101–111)
Creatinine, Ser: 1.11 mg/dL — ABNORMAL HIGH (ref 0.44–1.00)
GFR calc Af Amer: >60 mL/min (ref >60)
GFR, EST NON AFRICAN AMERICAN: 55 mL/min — AB (ref >60)
GLUCOSE: 144 mg/dL — AB (ref 65–99)
POTASSIUM: 3.6 mmol/L (ref 3.5–5.1)
Sodium: 139 mmol/L (ref 135–145)
Total Bilirubin: 0.6 mg/dL (ref 0.3–1.2)
Total Protein: 8.2 g/dL — ABNORMAL HIGH (ref 6.5–8.1)

## 2016-06-08 LAB — URINALYSIS, ROUTINE W REFLEX MICROSCOPIC
Bilirubin Urine: NEGATIVE
Glucose, UA: NEGATIVE mg/dL
Hgb urine dipstick: NEGATIVE
Ketones, ur: NEGATIVE mg/dL
LEUKOCYTES UA: NEGATIVE
Nitrite: NEGATIVE
PH: 5 (ref 5.0–8.0)
Protein, ur: NEGATIVE mg/dL
Specific Gravity, Urine: 1.007 (ref 1.005–1.030)

## 2016-06-08 LAB — LIPASE, BLOOD: LIPASE: 25 U/L (ref 11–51)

## 2016-06-08 LAB — CBC WITH DIFFERENTIAL/PLATELET
Basophils Absolute: 0 10*3/uL (ref 0.0–0.1)
Basophils Relative: 0 %
Eosinophils Absolute: 0 10*3/uL (ref 0.0–0.7)
Eosinophils Relative: 1 %
HCT: 45.6 % (ref 36.0–46.0)
HEMOGLOBIN: 15.6 g/dL — AB (ref 12.0–15.0)
LYMPHS PCT: 9 %
Lymphs Abs: 0.4 10*3/uL — ABNORMAL LOW (ref 0.7–4.0)
MCH: 29.6 pg (ref 26.0–34.0)
MCHC: 34.2 g/dL (ref 30.0–36.0)
MCV: 86.5 fL (ref 78.0–100.0)
MONOS PCT: 1 %
Monocytes Absolute: 0 10*3/uL — ABNORMAL LOW (ref 0.1–1.0)
NEUTROS ABS: 3.9 10*3/uL (ref 1.7–7.7)
Neutrophils Relative %: 89 %
Platelets: 157 10*3/uL (ref 150–400)
RBC: 5.27 MIL/uL — ABNORMAL HIGH (ref 3.87–5.11)
RDW: 13.8 % (ref 11.5–15.5)
WBC: 4.4 10*3/uL (ref 4.0–10.5)

## 2016-06-08 MED ORDER — ONDANSETRON HCL 4 MG/2ML IJ SOLN
4.0000 mg | Freq: Once | INTRAMUSCULAR | Status: AC
  Administered 2016-06-08: 4 mg via INTRAVENOUS
  Filled 2016-06-08: qty 2

## 2016-06-08 MED ORDER — ACETAMINOPHEN 325 MG PO TABS
650.0000 mg | ORAL_TABLET | Freq: Once | ORAL | Status: AC
  Administered 2016-06-08: 650 mg via ORAL
  Filled 2016-06-08: qty 2

## 2016-06-08 MED ORDER — SODIUM CHLORIDE 0.9 % IV BOLUS (SEPSIS)
1000.0000 mL | Freq: Once | INTRAVENOUS | Status: AC
  Administered 2016-06-08: 1000 mL via INTRAVENOUS

## 2016-06-08 MED ORDER — ONDANSETRON HCL 4 MG PO TABS: 4.0000 mg | ORAL_TABLET | Freq: Four times a day (QID) | ORAL | 0 refills | Status: AC

## 2016-06-08 NOTE — ED Provider Notes (Signed)
[facility] Provider Note   CSN: [id number] Arrival date & time: 06/08/16  0919     History   Chief Complaint Chief Complaint  Patient presents with  . Emesis    HPI Dawn Cochran is a 56 y.o. female.  Patient is a 56 year old female with a history of hypertension presenting today with sudden onset of nausea, vomiting and fever. Patient felt completely normal when she went to bed last night. When she got to work this morning she started to not feel well. She has had 7-8 episodes of vomiting over the last few hours. She is not sure when the fever started. She denies any abdominal pain, back pain, chest pain, shortness of breath. No flank pain or urinary complaints. No drug or alcohol use.   The history is provided by the patient.  Emesis   This is a new problem. The current episode started 3 to 5 hours ago. Episode frequency: vomited about 7-8 times since this started this morning. The problem has not changed since onset.The emesis has an appearance of stomach contents. The maximum temperature recorded prior to her arrival was 100 to 100.9 F. The fever has been present for less than 1 day. Associated symptoms include chills, a fever and myalgias. Pertinent negatives include no abdominal pain, no cough, no diarrhea and no URI. Risk factors: works at a [employer] facility and unsure if has had sick contacts.    Past Medical History:  Diagnosis Date  . Cancer ([place])   . 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 Hypertension     There are no active problems to display for this patient.   History reviewed. No pertinent surgical history.  OB History    No data available       Home Medications    Prior to Admission medications   Not on File    Family History History reviewed. No pertinent family history.  Social History Social History  Substance Use Topics  . Smoking status: Never Smoker  . Smokeless tobacco: Never  Used  . Alcohol use Not on file     Allergies   Codeine and Sulfa antibiotics   Review of Systems Review of Systems  Constitutional: Positive for chills and fever.  Respiratory: Negative for cough.   Gastrointestinal: Positive for vomiting. Negative for abdominal pain and diarrhea.  Musculoskeletal: Positive for myalgias.  All other systems reviewed and are negative.    Physical Exam Updated Vital Signs BP (!) 147/96 (BP Location: Left Arm)   Pulse (!) 142   Temp (!) 100.7 F (38.2 C) (Oral)   Resp 18   Ht 5\' 3"  (1.6 m)   Wt 123.8 kg (273 lb)   SpO2 95%   BMI 48.36 kg/m   Physical Exam  Constitutional: She is oriented to person, place, and time. She appears well-developed and well-nourished. No distress.  HENT:  Head: Normocephalic and atraumatic.  Mouth/Throat: Oropharynx is clear and moist.  Eyes: Conjunctivae and EOM are normal. Pupils are equal, round, and reactive to light.  Neck: Normal range of motion. Neck supple.  Cardiovascular: Regular rhythm and intact distal pulses.  Tachycardia present.   No murmur heard. Pulmonary/Chest: Effort normal and breath sounds normal. No respiratory distress. She has no wheezes. She has no rales.  Abdominal: Soft. She exhibits no distension. There is no tenderness. There is no rebound and no guarding.  No flank tenderness  Musculoskeletal:  Normal range of motion. She exhibits no edema or tenderness.  Neurological: She is alert and oriented to person, place, and time.  Skin: Skin is warm and dry. No rash noted. No erythema.  Hot to the touch  Psychiatric: She has a normal mood and affect. Her behavior is normal.  Nursing note and vitals reviewed.    ED Treatments / Results  Labs (all labs ordered are listed, but only abnormal results are displayed) Labs Reviewed  CBC WITH DIFFERENTIAL/PLATELET - Abnormal; Notable for the following:       Result Value   RBC 5.27 (*)    Hemoglobin 15.6 (*)    Lymphs Abs 0.4 (*)     Monocytes Absolute 0.0 (*)    All other components within normal limits  COMPREHENSIVE METABOLIC PANEL - Abnormal; Notable for the following:    Glucose, Bld 144 (*)    Creatinine, Ser 1.11 (*)    Total Protein 8.2 (*)    AST 51 (*)    ALT 56 (*)    GFR calc non Af Amer 55 (*)    All other components within normal limits  LIPASE, BLOOD  URINALYSIS, ROUTINE W REFLEX MICROSCOPIC    EKG  EKG Interpretation  Date/Time:  Wednesday Jun 08 2016 09:46:34 EDT Ventricular Rate:  124 PR Interval:    QRS Duration: 88 QT Interval:  297 QTC Calculation: 427 R Axis:   59 Text Interpretation:  Sinus tachycardia No significant change since last tracing Confirmed by Blanchie Dessert [phone]) on 06/08/2016 9:57:44 AM       Radiology No results found.  Procedures Procedures (including critical care time)  Medications Ordered in ED Medications  ondansetron (ZOFRAN) injection 4 mg (not administered)  sodium chloride 0.9 % bolus 1,000 mL (not administered)  acetaminophen (TYLENOL) tablet 650 mg (not administered)     Initial Impression / Assessment and Plan / ED Course  I have reviewed the triage vital signs and the nursing notes.  Pertinent labs & imaging results that were available during my care of the patient were reviewed by me and considered in my medical decision making (see chart for details).     Patient presenting today with fever, nausea and vomiting. She has no reproducible abdominal or flank pain. She denies any urinary symptoms. Sudden onset of symptoms. She has had prior cholecystectomy so low suspicion for gallbladder cause. Patient is tachycardic to 140 which may be related to the vomiting and fever however also concern for potential dysrhythmia. Blood pressure is stable. Suspect most likely viral pathology as patient lives in a skilled nursing facility and has multiple possibilities for sick contacts. She denies any bad food exposure or antibiotic use.  Patient treated  with IV fluids, Tylenol and Zofran. CBC, CMP, lipase, UA and EKG pending  10:40 AM Labs without acute findings and EKG with sinus tachy only.  Pt starting to feel better and tolerating po challenge.  Will give 1 more litr of fluid and d/c home.  Final Clinical Impressions(s) / ED Diagnoses   Final diagnoses:  Dehydration  Intractable vomiting with nausea, unspecified vomiting type  Fever, unspecified fever cause    New Prescriptions New Prescriptions   ONDANSETRON (ZOFRAN) 4 MG TABLET    Take 1 tablet (4 mg total) by mouth every 6 (six) hours.     Blanchie Dessert, MD 06/08/16 1128

## 2016-06-08 NOTE — ED Triage Notes (Signed)
Pt reports sudden onset of vomiting at 7am, denies any diarrhea, states she had fever this am. Dr. Maryan Rued at bedside for exam.

## 2016-06-11 ENCOUNTER — Emergency Department (HOSPITAL_BASED_OUTPATIENT_CLINIC_OR_DEPARTMENT_OTHER)
Admission: EM | Admit: 2016-06-11 | Discharge: 2016-06-11 | Disposition: A | Discharge status: Home / Self Care | Payer: 59 | Attending: Emergency Medicine | Admitting: Emergency Medicine

## 2016-06-11 ENCOUNTER — Emergency Department (HOSPITAL_BASED_OUTPATIENT_CLINIC_OR_DEPARTMENT_OTHER): Payer: 59

## 2016-06-11 DIAGNOSIS — R52 Pain, unspecified: Secondary | ICD-10-CM

## 2016-06-11 DIAGNOSIS — Z79899 Other long term (current) drug therapy: Secondary | ICD-10-CM | POA: Insufficient documentation

## 2016-06-11 DIAGNOSIS — I10 Essential (primary) hypertension: Secondary | ICD-10-CM | POA: Insufficient documentation

## 2016-06-11 DIAGNOSIS — R197 Diarrhea, unspecified: Secondary | ICD-10-CM | POA: Diagnosis not present

## 2016-06-11 DIAGNOSIS — Z85840 Personal history of malignant neoplasm of eye: Secondary | ICD-10-CM | POA: Diagnosis not present

## 2016-06-11 DIAGNOSIS — R51 Headache: Secondary | ICD-10-CM | POA: Diagnosis present

## 2016-06-11 DIAGNOSIS — R112 Nausea with vomiting, unspecified: Secondary | ICD-10-CM | POA: Insufficient documentation

## 2016-06-11 LAB — COMPREHENSIVE METABOLIC PANEL
ALT: 36 U/L (ref 14–54)
ANION GAP: 8 (ref 5–15)
AST: 26 U/L (ref 15–41)
Albumin: 4 g/dL (ref 3.5–5.0)
Alkaline Phosphatase: 53 U/L (ref 38–126)
BUN: 11 mg/dL (ref 6–20)
CHLORIDE: 106 mmol/L (ref 101–111)
CO2: 26 mmol/L (ref 22–32)
Calcium: 8.8 mg/dL — ABNORMAL LOW (ref 8.9–10.3)
Creatinine, Ser: 0.77 mg/dL (ref 0.44–1.00)
Glucose, Bld: 101 mg/dL — ABNORMAL HIGH (ref 65–99)
POTASSIUM: 3.3 mmol/L — AB (ref 3.5–5.1)
Sodium: 140 mmol/L (ref 135–145)
Total Bilirubin: 0.5 mg/dL (ref 0.3–1.2)
Total Protein: 8.1 g/dL (ref 6.5–8.1)

## 2016-06-11 LAB — CBC WITH DIFFERENTIAL/PLATELET
BASOS ABS: 0 10*3/uL (ref 0.0–0.1)
Basophils Relative: 0 %
Eosinophils Absolute: 0.1 10*3/uL (ref 0.0–0.7)
Eosinophils Relative: 3 %
HEMATOCRIT: 41.8 % (ref 36.0–46.0)
HEMOGLOBIN: 14.3 g/dL (ref 12.0–15.0)
LYMPHS PCT: 43 %
Lymphs Abs: 1.7 10*3/uL (ref 0.7–4.0)
MCH: 29.8 pg (ref 26.0–34.0)
MCHC: 34.2 g/dL (ref 30.0–36.0)
MCV: 87.1 fL (ref 78.0–100.0)
Monocytes Absolute: 0.3 10*3/uL (ref 0.1–1.0)
Monocytes Relative: 8 %
NEUTROS ABS: 1.8 10*3/uL (ref 1.7–7.7)
NEUTROS PCT: 46 %
Platelets: 149 10*3/uL — ABNORMAL LOW (ref 150–400)
RBC: 4.8 MIL/uL (ref 3.87–5.11)
RDW: 13.9 % (ref 11.5–15.5)
WBC: 3.9 10*3/uL — AB (ref 4.0–10.5)

## 2016-06-11 MED ORDER — METOCLOPRAMIDE HCL 5 MG/ML IJ SOLN
20.0000 mg | Freq: Once | INTRAVENOUS | Status: DC
  Filled 2016-06-11: qty 4

## 2016-06-11 MED ORDER — DIPHENHYDRAMINE HCL 50 MG/ML IJ SOLN
25.0000 mg | Freq: Once | INTRAMUSCULAR | Status: AC
  Administered 2016-06-11: 25 mg via INTRAVENOUS
  Filled 2016-06-11: qty 1

## 2016-06-11 MED ORDER — METOCLOPRAMIDE HCL 5 MG/ML IJ SOLN
INTRAMUSCULAR | Status: AC
Start: 2016-06-11 — End: 2016-06-11
  Administered 2016-06-11: 20 mg
  Filled 2016-06-11: qty 4

## 2016-06-11 NOTE — ED Provider Notes (Signed)
[facility] DEPT Provider Note   CSN: [id number] Arrival date & time: 06/11/16  1055     History   Chief Complaint Chief Complaint  Patient presents with  . body aches    HPI Monte Bronder is a 56 y.o. female.  The history is provided by the patient. No language interpreter was used.   Kevin Mario is a 56 y.o. female who presents to the Emergency Department complaining of body aches.  3 days ago she developed sudden onset chills with nausea, vomiting, diarrhea. She had mild associated headache at that time. She was seen in the emergency department and received IV fluids and discharged home. Vomiting and diarrhea have since resolved but now she reports mild frontal headache bilaterally that is been waxing and waning since that time. She has subjective fevers but has not checked her temperature at home. No numbness, weakness, dysuria, chest pain, shortness of breath. She has mild associated sore throat. 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 No other medical issues. No rash or history of tick bite. Past Medical History:  Diagnosis Date  . Cancer ([place])   . 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 Hypertension     There are no active problems to display for this patient.   No past surgical history on file.  OB History    No data available       Home Medications    Prior to Admission medications   Medication Sig Start Date End Date Taking? Authorizing Provider  lisinopril (PRINIVIL,ZESTRIL) 20 MG tablet Take 20 mg by mouth daily.   Yes [provider]  ondansetron (ZOFRAN) 4 MG tablet Take 1 tablet (4 mg total) by mouth every 6 (six) hours. 06/08/16   Blanchie Dessert, MD    Family History No family history on file.  Social History Social History  Substance Use Topics  . Smoking status: Never Smoker  . Smokeless tobacco: Never Used  . Alcohol use Not on file       Allergies   Codeine and Sulfa antibiotics   Review of Systems Review of Systems  All other systems reviewed and are negative.    Physical Exam Updated Vital Signs BP (!) 132/95 (BP Location: Left Arm)   Pulse 77   Temp 98.2 F (36.8 C) (Oral)   Resp 18   SpO2 98%   Physical Exam  Constitutional: She is oriented to person, place, and time. She appears well-developed and well-nourished.  HENT:  Head: Normocephalic and atraumatic.  Mouth/Throat: Oropharynx is clear and moist.  Eyes: EOM are normal. Pupils are equal, round, and reactive to light.  Neck: Neck supple.  Cardiovascular: Normal rate and regular rhythm.   No murmur heard. Pulmonary/Chest: Effort normal and breath sounds normal. No respiratory distress.  Abdominal: Soft. There is no tenderness. There is no rebound and no guarding.  Musculoskeletal: She exhibits no edema or tenderness.  Neurological: She is alert and oriented to person, place, and time.  Skin: Skin is warm and dry.  Psychiatric: She has a normal mood and affect. Her behavior is normal.  Nursing note and vitals reviewed.    ED Treatments / Results  Labs (all labs ordered are listed, but only abnormal results are displayed) Labs Reviewed  COMPREHENSIVE METABOLIC PANEL - Abnormal; Notable for the following:       Result Value  Potassium 3.3 (*)    Glucose, Bld 101 (*)    Calcium 8.8 (*)    All other components within normal limits  CBC WITH DIFFERENTIAL/PLATELET - Abnormal; Notable for the following:    WBC 3.9 (*)    Platelets 149 (*)    All other components within normal limits    EKG  EKG Interpretation None       Radiology Ct Head Wo Contrast  Result Date: 06/11/2016 CLINICAL DATA:  56 year old female with history of nausea and vomiting. Elevated blood pressure. History of melanoma of the right eye. EXAM: CT HEAD WITHOUT CONTRAST TECHNIQUE: Contiguous axial images were obtained from the base of the skull through the  vertex without intravenous contrast. COMPARISON:  No priors. FINDINGS: Brain: No evidence of acute infarction, hemorrhage, hydrocephalus, extra-axial collection or mass lesion/mass effect. Vascular: No hyperdense vessel or unexpected calcification. Skull: Normal. Negative for fracture or focal lesion. Sinuses/Orbits: No acute finding. Other: None. IMPRESSION: 1. No acute intracranial abnormalities. 2. The appearance of the brain is normal. Electronically Signed   By: Vinnie Langton M.D.   On: 06/11/2016 12:18    Procedures Procedures (including critical care time)  Medications Ordered in ED Medications  diphenhydrAMINE (BENADRYL) injection 25 mg (25 mg Intravenous Given 06/11/16 1226)  metoCLOPramide (REGLAN) 5 MG/ML injection (20 mg  Given 06/11/16 1226)     Initial Impression / Assessment and Plan / ED Course  I have reviewed the triage vital signs and the nursing notes.  Pertinent labs & imaging results that were available during my care of the patient were reviewed by me and considered in my medical decision making (see chart for details).     Patient here for evaluation body aches, headache. She was recently seen in the ED for diarrhea and chills. Her diarrhea has since resolved. Her headache resolved after headache cocktail. Presentation is not consistent with meningitis, subarachnoid hemorrhage, serious bacterial infection.  Counseled patient on home care for body aches. Discussed outpatient follow-up and return precautions.  Final Clinical Impressions(s) / ED Diagnoses   Final diagnoses:  Body aches    New Prescriptions Discharge Medication List as of 06/11/2016  3:16 PM       Quintella Reichert, MD 06/12/16 1208

## 2016-06-11 NOTE — ED Triage Notes (Addendum)
Pt seen Wednesday for N/V, states those symptoms have resolved but still not feeling well, states every thing hurts. Pt also states BP has been running high despite medication

## 2018-02-26 IMAGING — CT CT HEAD W/O CM
3 series · 14 of 47 positions shown, 16 images · non-contrast
Comparison: No priors.

CLINICAL DATA: 55-year-old female with history of nausea and
vomiting. Elevated blood pressure. History of melanoma of the right
eye.

EXAM:
CT HEAD WITHOUT CONTRAST
TECHNIQUE: Contiguous axial images were obtained from the base of the skull
through the vertex without intravenous contrast.

[Series 2: head wo · axial · 0.43mm/px · z∈[+599,+734]mm · 8 of 33 slices shown, 10 images]
[im 3/33  brain]
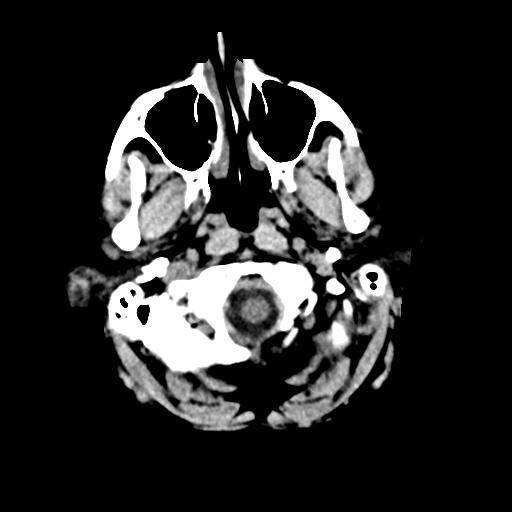
[im 3/33  bone]
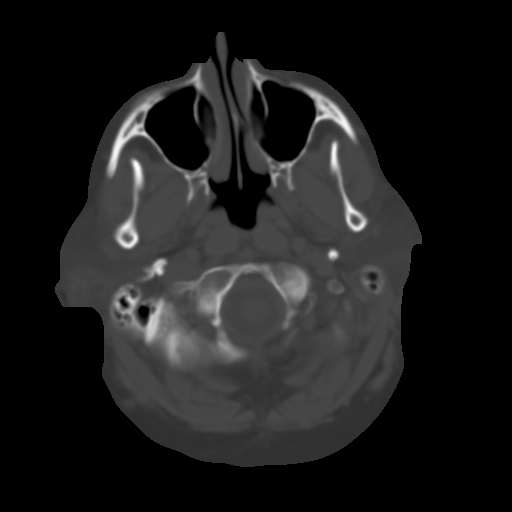
[im 7/33  brain]
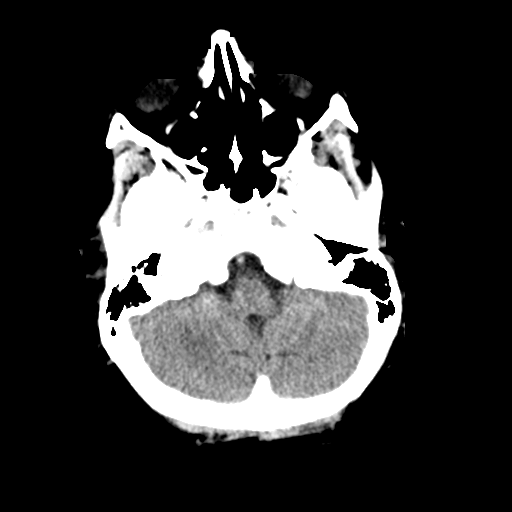
[im 10/33  brain]
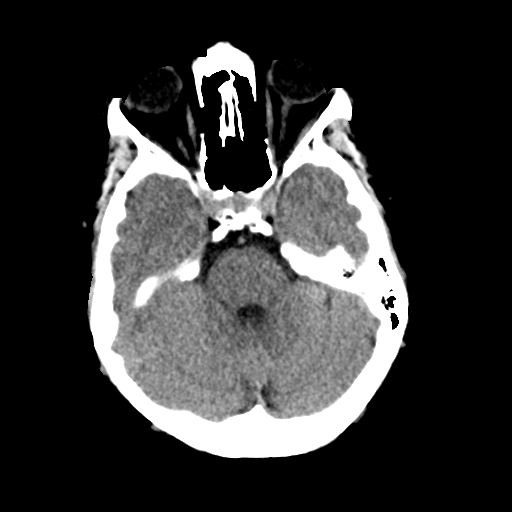
[im 15/33  brain]
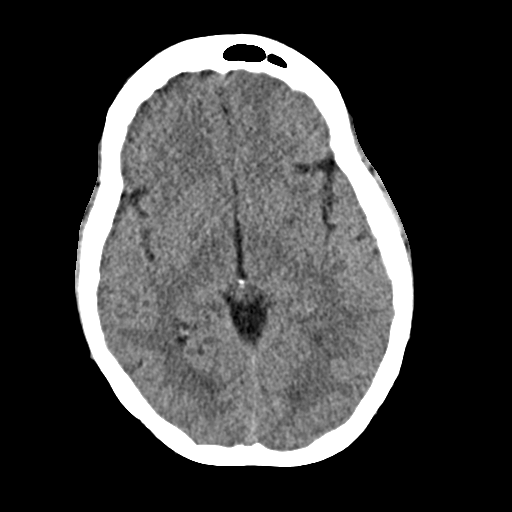
[im 18/33  brain]
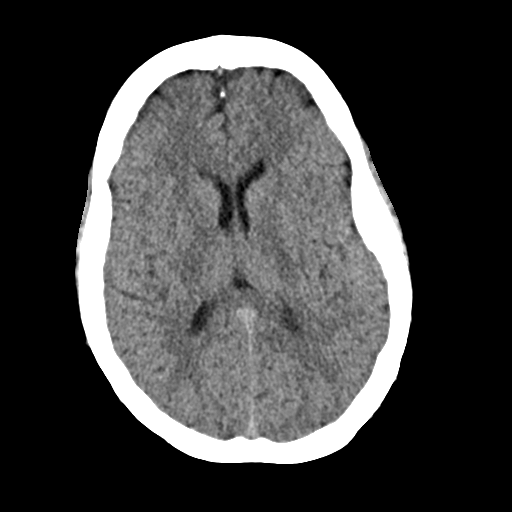
[im 18/33  bone]
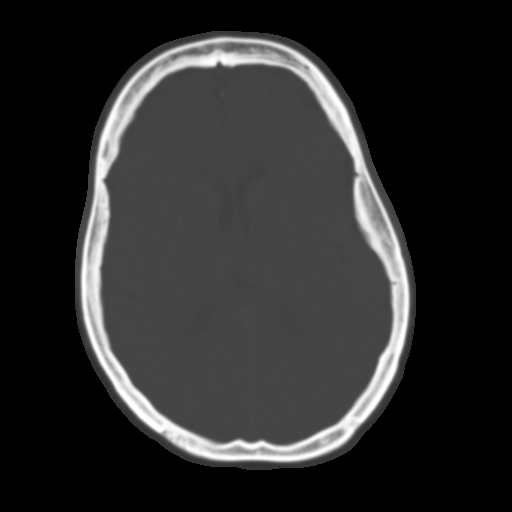
[im 23/33  brain]
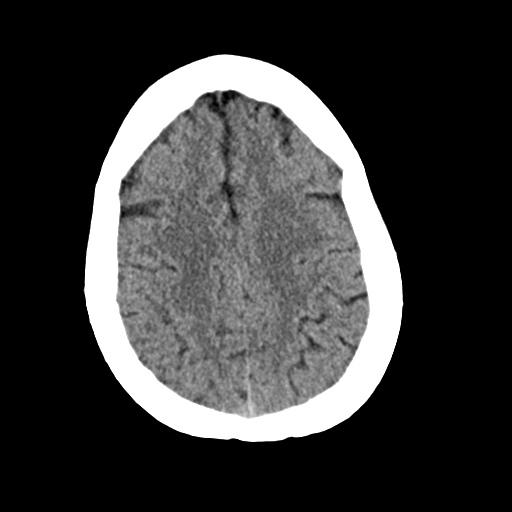
[im 26/33  brain]
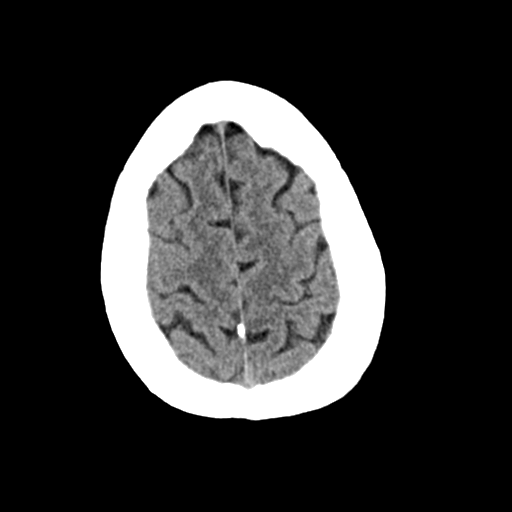
[im 30/33  brain]
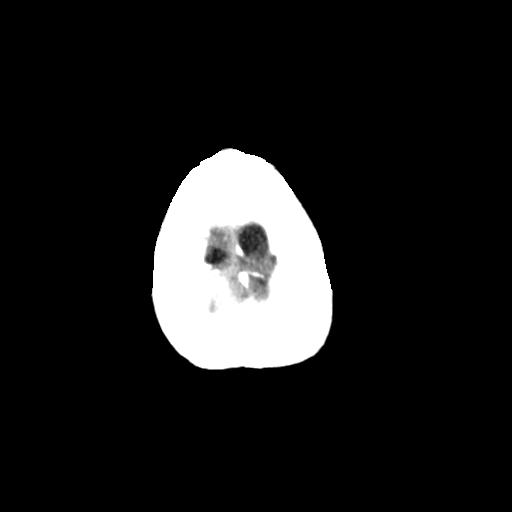

[Series 4: coronal soft · coronal · 0.31mm/px · 3 of 65 slices shown]
[im 22/65  brain]
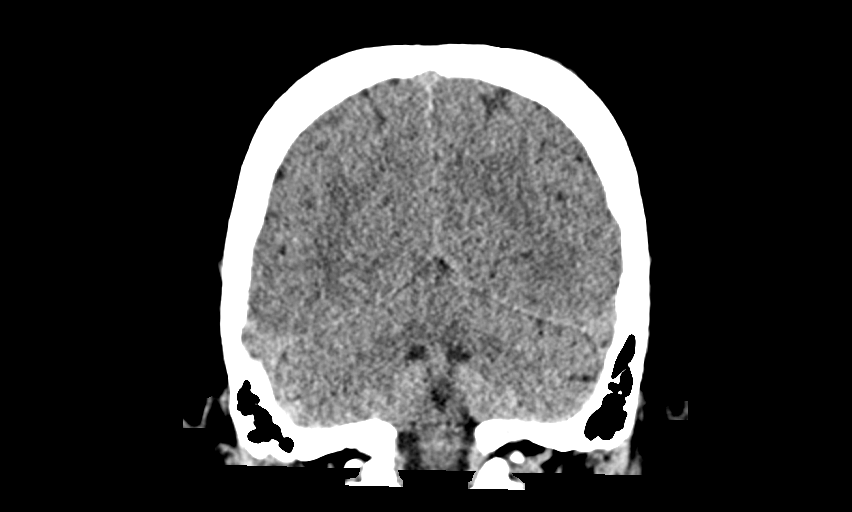
[im 29/65  brain]
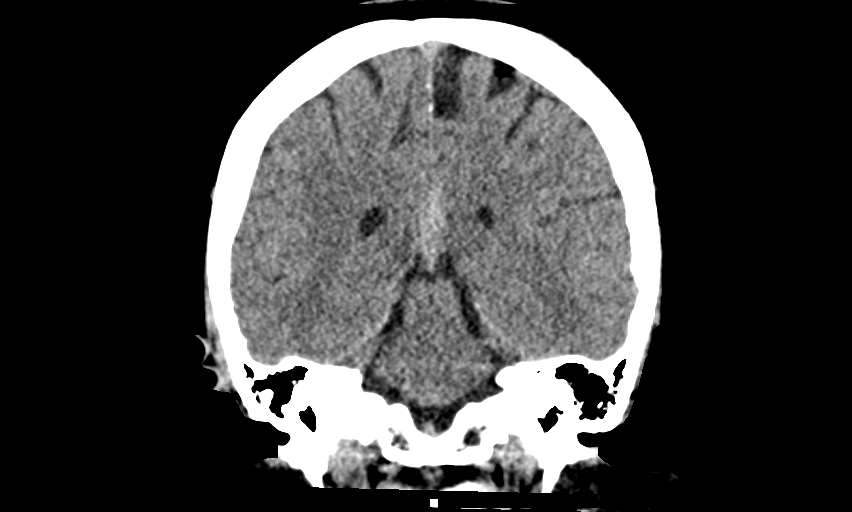
[im 36/65  brain]
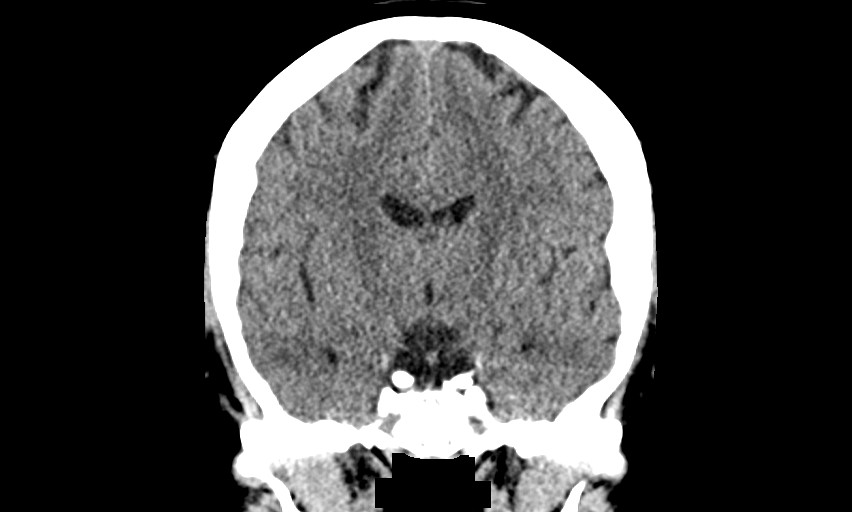

[Series 5: sag soft · sagittal · 0.30mm/px · 3 of 55 slices shown]
[im 19/55  brain]
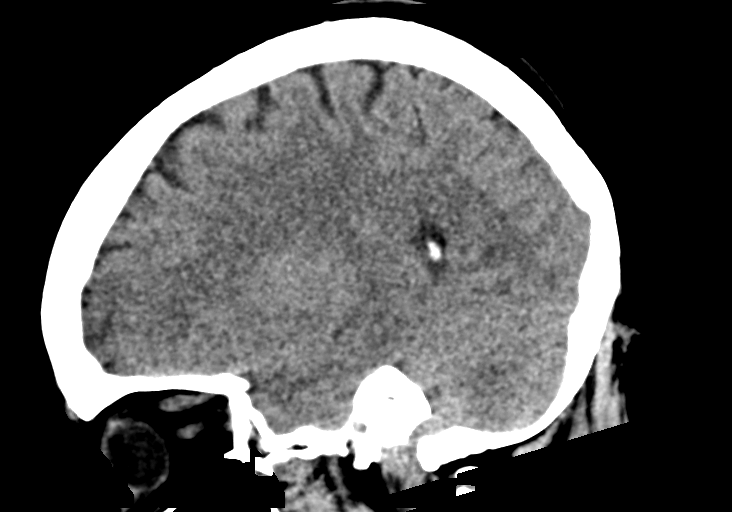
[im 28/55  brain]
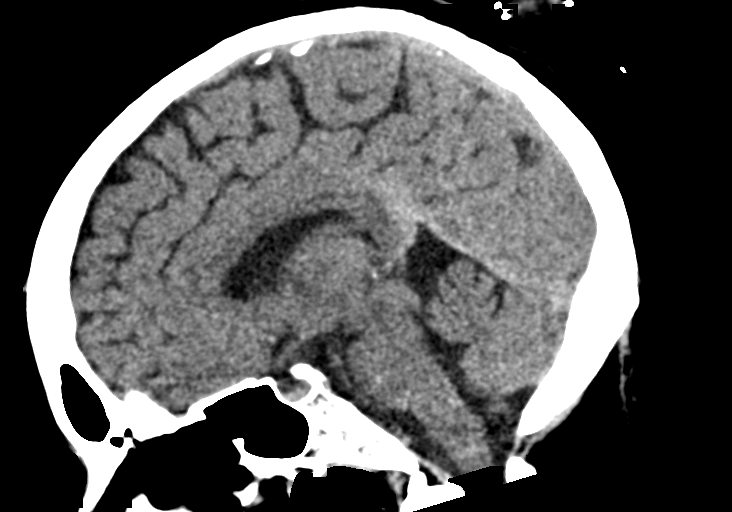
[im 37/55  brain]
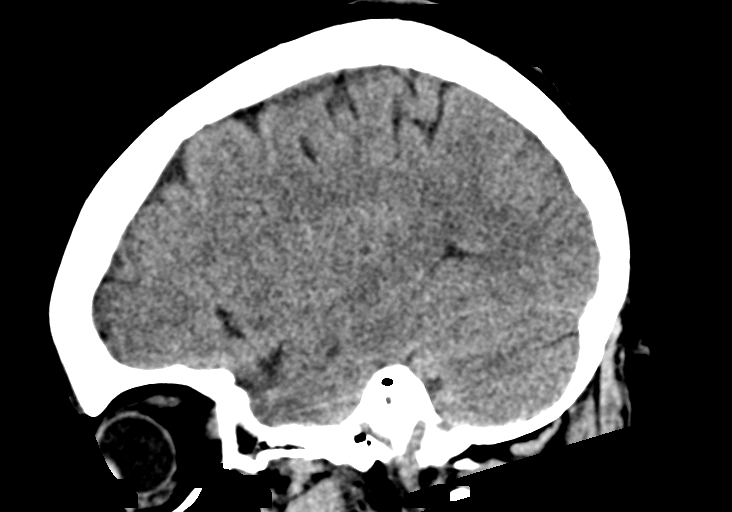

[14 of 47 positions shown; findings below may reference images not displayed]

FINDINGS: Brain: No evidence of acute infarction, hemorrhage, hydrocephalus,
extra-axial collection or mass lesion/mass effect.

Vascular: No hyperdense vessel or unexpected calcification.

Skull: Normal. Negative for fracture or focal lesion.

Sinuses/Orbits: No acute finding.

Other: None.
IMPRESSION: 1. No acute intracranial abnormalities.
2. The appearance of the brain is normal.
